# Patient Record
Sex: Female | Born: 1975 | Race: White | Hispanic: No | Marital: Married | Smoking: Never smoker
Health system: Southern US, Community
[De-identification: ages and names within clinical notes are randomized; demographics above are authoritative.]

## PROBLEM LIST (undated history)

## (undated) DIAGNOSIS — G5600 Carpal tunnel syndrome, unspecified upper limb: Secondary | ICD-10-CM

## (undated) HISTORY — PX: GASTRIC BYPASS: SHX52

---

## 2008-08-27 ENCOUNTER — Other Ambulatory Visit: Payer: Self-pay | Admitting: Emergency Medicine

## 2008-08-27 ENCOUNTER — Inpatient Hospital Stay (HOSPITAL_COMMUNITY): Admission: AD | Admit: 2008-08-27 | Discharge: 2008-08-28 | Payer: Self-pay | Admitting: Obstetrics and Gynecology

## 2008-08-30 ENCOUNTER — Inpatient Hospital Stay (HOSPITAL_COMMUNITY): Admission: AD | Admit: 2008-08-30 | Discharge: 2008-08-30 | Payer: Self-pay | Admitting: Obstetrics and Gynecology

## 2008-09-06 ENCOUNTER — Emergency Department (HOSPITAL_BASED_OUTPATIENT_CLINIC_OR_DEPARTMENT_OTHER): Admission: EM | Admit: 2008-09-06 | Discharge: 2008-09-06 | Payer: Self-pay | Admitting: Emergency Medicine

## 2011-08-17 LAB — URINE MICROSCOPIC-ADD ON

## 2011-08-17 LAB — URINALYSIS, ROUTINE W REFLEX MICROSCOPIC
Bilirubin Urine: NEGATIVE
Glucose, UA: NEGATIVE
Ketones, ur: NEGATIVE
Specific Gravity, Urine: 1.018
Urobilinogen, UA: 1
pH: 7

## 2011-08-17 LAB — WET PREP, GENITAL: Trich, Wet Prep: NONE SEEN

## 2011-08-17 LAB — GC/CHLAMYDIA PROBE AMP, GENITAL
Chlamydia, DNA Probe: NEGATIVE
GC Probe Amp, Genital: NEGATIVE

## 2011-08-17 LAB — PREGNANCY, URINE: Preg Test, Ur: POSITIVE

## 2018-12-03 ENCOUNTER — Emergency Department (HOSPITAL_COMMUNITY)
Admission: EM | Admit: 2018-12-03 | Discharge: 2018-12-03 | Disposition: A | Discharge status: Home / Self Care | Payer: BC Managed Care – PPO | Attending: Emergency Medicine | Admitting: Emergency Medicine

## 2018-12-03 ENCOUNTER — Other Ambulatory Visit: Payer: Self-pay

## 2018-12-03 ENCOUNTER — Emergency Department (HOSPITAL_COMMUNITY): Payer: BC Managed Care – PPO

## 2018-12-03 ENCOUNTER — Encounter (HOSPITAL_COMMUNITY): Payer: Self-pay

## 2018-12-03 DIAGNOSIS — K2970 Gastritis, unspecified, without bleeding: Secondary | ICD-10-CM | POA: Diagnosis not present

## 2018-12-03 DIAGNOSIS — R1013 Epigastric pain: Secondary | ICD-10-CM | POA: Diagnosis present

## 2018-12-03 HISTORY — DX: Carpal tunnel syndrome, unspecified upper limb: G56.00

## 2018-12-03 LAB — I-STAT CHEM 8, ED
BUN: 11 mg/dL (ref 6–20)
CALCIUM ION: 1.22 mmol/L (ref 1.15–1.40)
Chloride: 106 mmol/L (ref 98–111)
Creatinine, Ser: 0.6 mg/dL (ref 0.44–1.00)
Glucose, Bld: 94 mg/dL (ref 70–99)
HCT: 37 % (ref 36.0–46.0)
HEMOGLOBIN: 12.6 g/dL (ref 12.0–15.0)
POTASSIUM: 3.9 mmol/L (ref 3.5–5.1)
SODIUM: 142 mmol/L (ref 135–145)
TCO2: 26 mmol/L (ref 22–32)

## 2018-12-03 LAB — COMPREHENSIVE METABOLIC PANEL
ALBUMIN: 3.5 g/dL (ref 3.5–5.0)
ALT: 32 U/L (ref 0–44)
AST: 45 U/L — ABNORMAL HIGH (ref 15–41)
Alkaline Phosphatase: 71 U/L (ref 38–126)
Anion gap: 10 (ref 5–15)
BUN: 8 mg/dL (ref 6–20)
CHLORIDE: 107 mmol/L (ref 98–111)
CO2: 23 mmol/L (ref 22–32)
Calcium: 9 mg/dL (ref 8.9–10.3)
Creatinine, Ser: 0.7 mg/dL (ref 0.44–1.00)
GFR calc non Af Amer: >60 mL/min (ref >60)
GLUCOSE: 100 mg/dL — AB (ref 70–99)
POTASSIUM: 4 mmol/L (ref 3.5–5.1)
SODIUM: 140 mmol/L (ref 135–145)
Total Bilirubin: 0.6 mg/dL (ref 0.3–1.2)
Total Protein: 6.2 g/dL — ABNORMAL LOW (ref 6.5–8.1)

## 2018-12-03 LAB — LIPASE, BLOOD: Lipase: 28 U/L (ref 11–51)

## 2018-12-03 LAB — CBC WITH DIFFERENTIAL/PLATELET
ABS IMMATURE GRANULOCYTES: 0.03 10*3/uL (ref 0.00–0.07)
BASOS ABS: 0 10*3/uL (ref 0.0–0.1)
Basophils Relative: 0 %
Eosinophils Absolute: 0.1 10*3/uL (ref 0.0–0.5)
Eosinophils Relative: 1 %
HEMATOCRIT: 39 % (ref 36.0–46.0)
Hemoglobin: 12.6 g/dL (ref 12.0–15.0)
IMMATURE GRANULOCYTES: 0 %
LYMPHS ABS: 1.2 10*3/uL (ref 0.7–4.0)
LYMPHS PCT: 14 %
MCH: 29 pg (ref 26.0–34.0)
MCHC: 32.3 g/dL (ref 30.0–36.0)
MCV: 89.9 fL (ref 80.0–100.0)
MONOS PCT: 7 %
Monocytes Absolute: 0.6 10*3/uL (ref 0.1–1.0)
NEUTROS ABS: 7.2 10*3/uL (ref 1.7–7.7)
NRBC: 0 % (ref 0.0–0.2)
Neutrophils Relative %: 78 %
Platelets: 140 10*3/uL — ABNORMAL LOW (ref 150–400)
RBC: 4.34 MIL/uL (ref 3.87–5.11)
RDW: 12.9 % (ref 11.5–15.5)
WBC: 9.2 10*3/uL (ref 4.0–10.5)

## 2018-12-03 LAB — I-STAT BETA HCG BLOOD, ED (MC, WL, AP ONLY)

## 2018-12-03 MED ORDER — MORPHINE SULFATE (PF) 4 MG/ML IV SOLN
6.0000 mg | Freq: Once | INTRAVENOUS | Status: AC
Start: 2018-12-03 — End: 2018-12-03
  Administered 2018-12-03: 6 mg via INTRAVENOUS
  Filled 2018-12-03: qty 2

## 2018-12-03 MED ORDER — PANTOPRAZOLE SODIUM 20 MG PO TBEC: 20.0000 mg | DELAYED_RELEASE_TABLET | Freq: Every day | ORAL | 0 refills | Status: AC

## 2018-12-03 MED ORDER — SUCRALFATE 1 G PO TABS: 1.0000 g | ORAL_TABLET | Freq: Three times a day (TID) | ORAL | 0 refills | Status: AC

## 2018-12-03 MED ORDER — ONDANSETRON HCL 4 MG/2ML IJ SOLN: 4.0000 mg | Freq: Four times a day (QID) | INTRAMUSCULAR | Status: DC | PRN

## 2018-12-03 MED ORDER — MORPHINE SULFATE (PF) 4 MG/ML IV SOLN: 6.0000 mg | INTRAVENOUS | Status: DC | PRN

## 2018-12-03 MED ORDER — IOHEXOL 300 MG/ML  SOLN
100.0000 mL | Freq: Once | INTRAMUSCULAR | Status: AC | PRN
  Administered 2018-12-03: 100 mL via INTRAVENOUS

## 2018-12-03 MED ORDER — PANTOPRAZOLE SODIUM 40 MG IV SOLR
40.0000 mg | Freq: Once | INTRAVENOUS | Status: AC
Start: 2018-12-03 — End: 2018-12-03
  Administered 2018-12-03: 40 mg via INTRAVENOUS
  Filled 2018-12-03: qty 40

## 2018-12-03 NOTE — ED Provider Notes (Signed)
  Physical Exam  BP 133/84   Pulse 68   Temp 97.8 F (36.6 C) (Oral)   Resp 14   Ht 4\' 10"  (1.473 m)   Wt 59 kg   SpO2 95%   BMI 27.17 kg/m   Physical Exam Vitals signs and nursing note reviewed.  Constitutional:      General: She is not in acute distress.    Appearance: She is well-developed. She is not diaphoretic.  HENT:     Head: Normocephalic and atraumatic.  Eyes:     General: No scleral icterus.    Conjunctiva/sclera: Conjunctivae normal.  Neck:     Musculoskeletal: Normal range of motion.  Pulmonary:     Effort: Pulmonary effort is normal. No respiratory distress.  Skin:    Findings: No rash.  Neurological:     Mental Status: She is alert.     ED Course/Procedures     Procedures   Ct Abdomen Pelvis W Contrast  Result Date: 12/03/2018 CLINICAL DATA:  Epigastric pain.  Pain during dinner EXAM: CT ABDOMEN AND PELVIS WITH CONTRAST TECHNIQUE: Multidetector CT imaging of the abdomen and pelvis was performed using the standard protocol following bolus administration of intravenous contrast. CONTRAST:  OMNIPAQUE IOHEXOL 300 MG/ML  SOLN COMPARISON:  Upper GI 01/09/2018 FINDINGS: Lower chest: Lung bases are clear. Hepatobiliary: No focal hepatic lesion. No biliary duct dilatation. Gallbladder is normal. Common bile duct is normal. Pancreas: Pancreas is normal. No ductal dilatation. No pancreatic inflammation. Spleen: Normal spleen Adrenals/urinary tract: Adrenal glands and kidneys are normal. The ureters and bladder normal. Stomach/Bowel: Gastric sleeve bariatric surgery anatomy. No evidence obstruction. Duodenum, small-bowel appendix normal. The colon and rectosigmoid colon are normal. Vascular/Lymphatic: Abdominal aorta is normal caliber. No periportal or retroperitoneal adenopathy. No pelvic adenopathy. Reproductive: IUD in expected location in the uterus. Tubal ligation clips noted. No adnexal abnormality. Other: No free fluid. Musculoskeletal: No aggressive osseous  lesion. IMPRESSION: 1. No acute abdominopelvic findings. 2. 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 3. Normal appendix. 4. Normal gallbladder. Electronically Signed   By: Genevive Bi M.D.   On: 12/03/2018 20:29    MDM  Care handed off from previous provider Dr. Rhunette Croft.  Please see his note for further detail.  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  Plan is to obtain lab work, CT of the abdomen pelvis for cause and reassess.  Given Protonix.  CT shows no complications of gastric sleeve or other abnormalities.  Lab work is unremarkable.  Will be discharged home with Protonix and Carafate.  Patient is hemodynamically stable, in NAD, and able to ambulate in the ED. Evaluation does not show pathology that would require ongoing emergent intervention or inpatient treatment. I explained the diagnosis to the patient. Pain has been managed and has no complaints prior to discharge. Patient is comfortable with above plan and is stable for discharge at this time. All questions were answered prior to disposition. Strict return precautions for returning to the ED were discussed. Encouraged follow up with PCP.    Portions of this note were generated with Scientist, clinical (histocompatibility and immunogenetics). Dictation errors may occur despite best attempts at proofreading.       Dietrich Pates, PA-C 12/03/18 2040    Sabas Sous, MD 12/03/18 [phone]

## 2018-12-03 NOTE — ED Triage Notes (Signed)
Pt endorses she was eating with friends when she had sudden 10/10 sharp epigastric pain and sweating. Pt was given 324 ASA en route. Pt denies N/V/D. Per EMS 12-lead unremarkable. Pt reports pain is now 7/10. Pt had gastric bypass in March, but denies any complications.

## 2018-12-03 NOTE — ED Provider Notes (Signed)
[facility] Provider Note   CSN: [id number] Arrival date & time: 12/03/18  1643     History   Chief Complaint Chief Complaint  Patient presents with  . Abdominal Pain    HPI Alexandra Farley is a 43 y.o. female.  HPI  43 year old female comes in with chief complaint of abdominal pain. Patient has history of gastric replacement in March 2019, multiple C-sections.  She states that after she had solid, she started having severe epigastric abdominal pain.  The pain is constant and nonradiating.  Pain is located mainly in the epigastric region and she denies any history of similar pain.  She denies any UTI-like symptoms, history of kidney stones or gallbladder problems.  She does not drink heavily or use any drugs.  Past Medical History:  Diagnosis Date  . Carpal tunnel syndrome    bilateral hands    There are no active problems to display for this patient.   Past Surgical History:  Procedure Laterality Date  . CESAREAN SECTION    . GASTRIC BYPASS       OB History   No obstetric history on file.      Home Medications    Prior to Admission medications   Not on File    Family History No family history on file.  Social History Social History   Tobacco Use  . Smoking status: Never Smoker  . Smokeless tobacco: Never Used  Substance Use Topics  . Alcohol use: Not Currently  . Drug use: Not on file     Allergies   Patient has no known allergies.   Review of Systems Review of Systems  Constitutional: Positive for activity change.  Respiratory: Negative for shortness of breath.   Cardiovascular: Negative for chest pain.  Gastrointestinal: Positive for abdominal pain and nausea.  Genitourinary: Negative for flank pain.  All other systems reviewed and are negative.    Physical Exam Updated Vital Signs BP 133/84   Pulse 68   Temp 97.8 F (36.6 C) (Oral)   Resp 14   Ht 4\' 10"  (1.473 m)   Wt 59 kg   SpO2 95%    BMI 27.17 kg/m   Physical Exam Vitals signs and nursing note reviewed.  Constitutional:      Appearance: She is well-developed.  HENT:     Head: Normocephalic and atraumatic.  Neck:     Musculoskeletal: Normal range of motion and neck supple.  Cardiovascular:     Rate and Rhythm: Normal rate.  Pulmonary:     Effort: Pulmonary effort is normal.  Abdominal:     General: Bowel sounds are normal.     Tenderness: There is abdominal tenderness in the epigastric area. There is guarding. There is no rebound.     Hernia: No hernia is present.  Skin:    General: Skin is warm and dry.  Neurological:     Mental Status: She is alert and oriented to person, place, and time.      ED Treatments / Results  Labs (all labs ordered are listed, but only abnormal results are displayed) Labs Reviewed  COMPREHENSIVE METABOLIC PANEL  CBC WITH DIFFERENTIAL/PLATELET  LIPASE, BLOOD  I-STAT BETA HCG BLOOD, ED ([facility], [facility], [facility] ONLY)  I-STAT CHEM 8, ED    EKG EKG Interpretation  Date/Time:  Sunday December 03 2018 16:51:49 EST Ventricular Rate:  70 PR Interval:  120 QRS Duration: 76 QT Interval:  394 QTC Calculation: 425 R Axis:   40  Text Interpretation:  Normal sinus rhythm Normal ECG No acute changes No old tracing to compare Confirmed by Derwood KaplanNanavati, Allysen Lazo [phone]([id number]) on 12/03/2018 6:28:37 PM   Radiology No results found.  Procedures Procedures (including critical care time)  Medications Ordered in ED Medications  morphine 4 MG/ML injection 6 mg (has no administration in time range)  ondansetron (ZOFRAN) injection 4 mg (has no administration in time range)  pantoprazole (PROTONIX) injection 40 mg (has no administration in time range)  morphine 4 MG/ML injection 6 mg (6 mg Intravenous Given 12/03/18 1852)     Initial Impression / Assessment and Plan / ED Course  I have reviewed the triage vital signs and the nursing notes.  Pertinent labs & imaging results that were available during my care  of the patient were reviewed by me and considered in my medical decision making (see chart for details).     DDx includes: Pancreatitis Hepatobiliary pathology including cholecystitis Gastritis/PUD SBO  43 year old with history of gastric sleeve repair in March 2019 and multiple C-sections comes in with epigastric abdominal pain.  Pain is sudden onset and has been constant.  Clinical concerns are high for small bowel obstruction versus internal hernia.  CT scan ordered.  Pancreatitis and cholelithiasis are lower in the differential.  If the CT scan is completely negative, then we will treat patient as if she has peptic ulcer disease as she is at high risk for it and she has not been taking her PPI as recommended. Signing care out to incoming team.   Final Clinical Impressions(s) / ED Diagnoses   Final diagnoses:  None    ED Discharge Orders    None       Derwood KaplanNanavati, Danzig Macgregor, MD 12/03/18 1904

## 2018-12-03 NOTE — ED Notes (Signed)
Patient transported to CT 

## 2019-07-27 IMAGING — CT CT ABD-PELV W/ CM
3 of 5 series · 16 of 46 positions shown, 18 images · IV contrast (APPLIED)
Comparison: Upper GI 01/09/2018

CLINICAL DATA: Epigastric pain.  Pain during dinner

EXAM:
CT ABDOMEN AND PELVIS WITH CONTRAST
TECHNIQUE: Multidetector CT imaging of the abdomen and pelvis was performed
using the standard protocol following bolus administration of
intravenous contrast.
CONTRAST:  100mL OMNIPAQUE IOHEXOL 300 MG/ML  SOLN

[Series 3: abdomen 5.0 · axial · 0.68mm/px · z∈[-439,-64]mm · 11 of 91 slices shown, 13 images]
[im 8/91  soft-tissue]
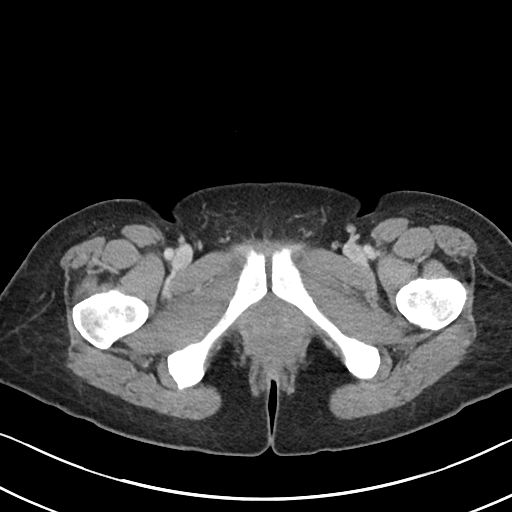
[im 8/91  bone]
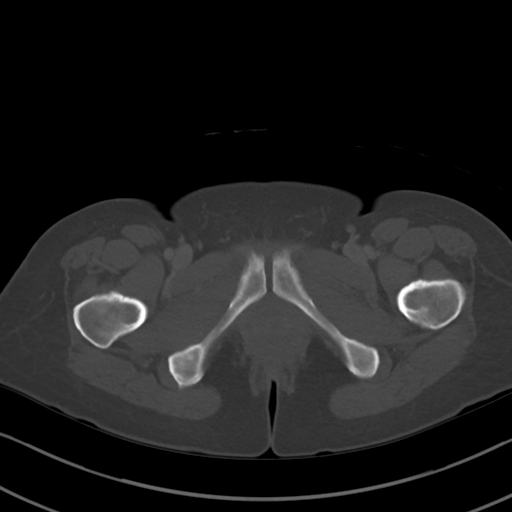
[im 16/91  soft-tissue]
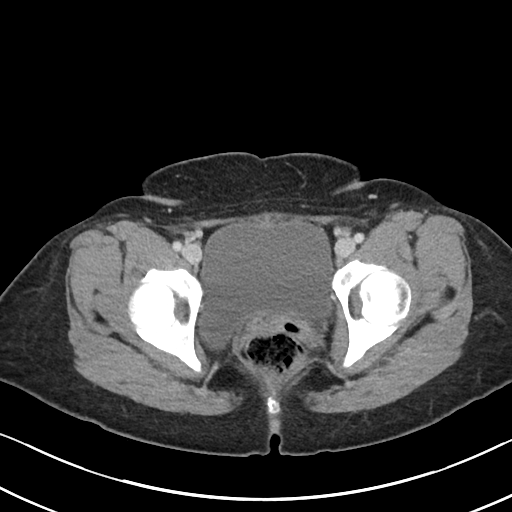
[im 23/91  soft-tissue]
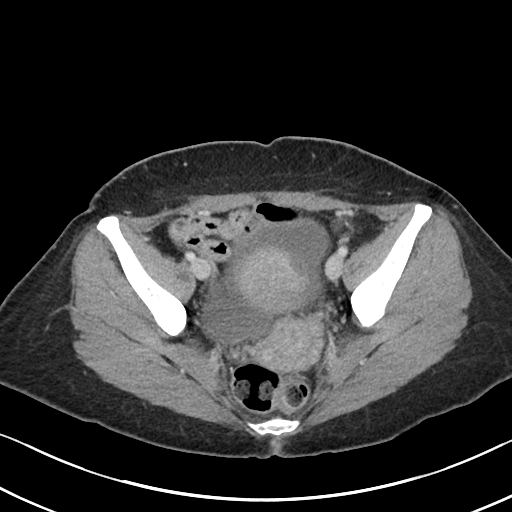
[im 31/91  soft-tissue]
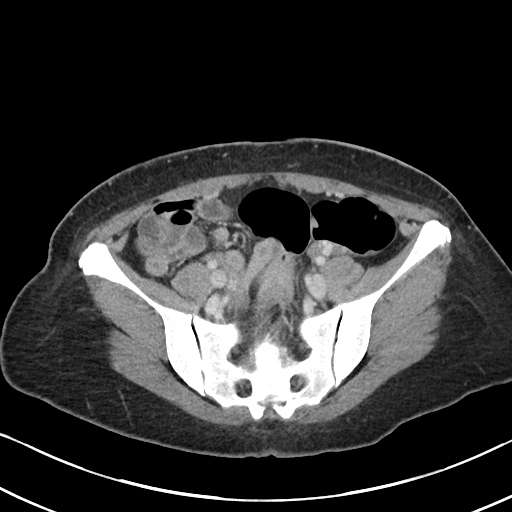
[im 38/91  soft-tissue]
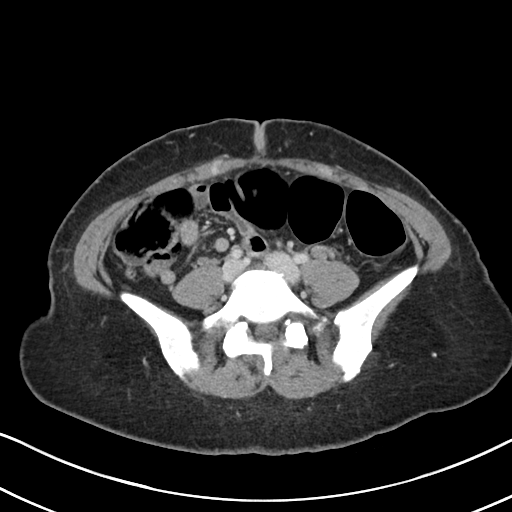
[im 46/91  soft-tissue]
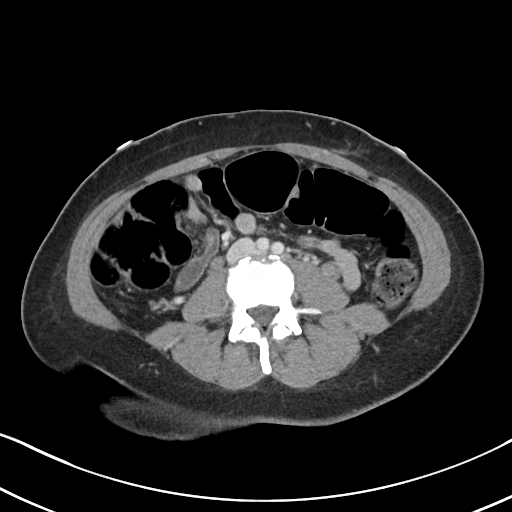
[im 53/91  soft-tissue]
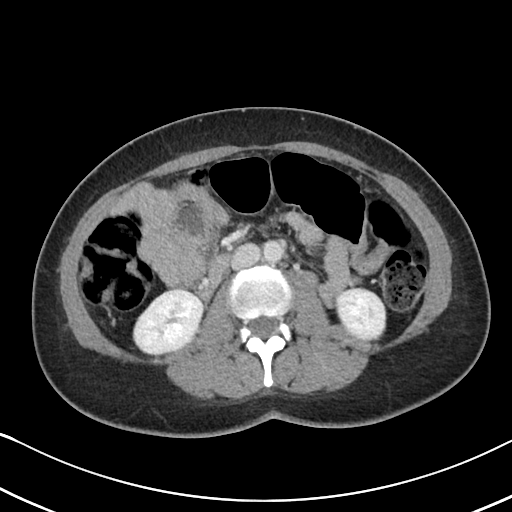
[im 61/91  soft-tissue]
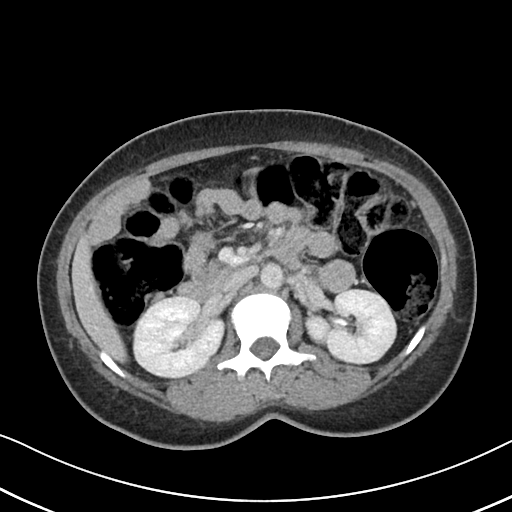
[im 68/91  soft-tissue]
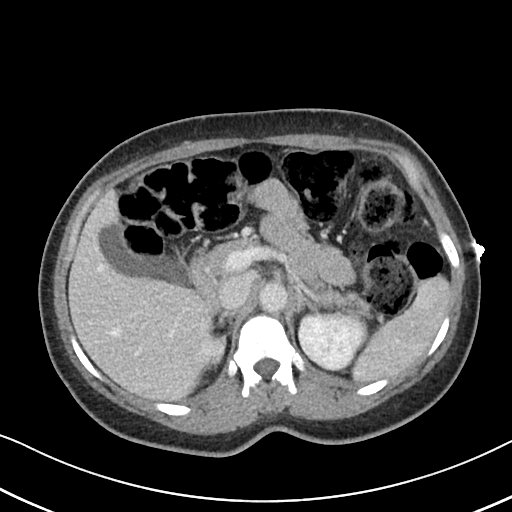
[im 68/91  bone]
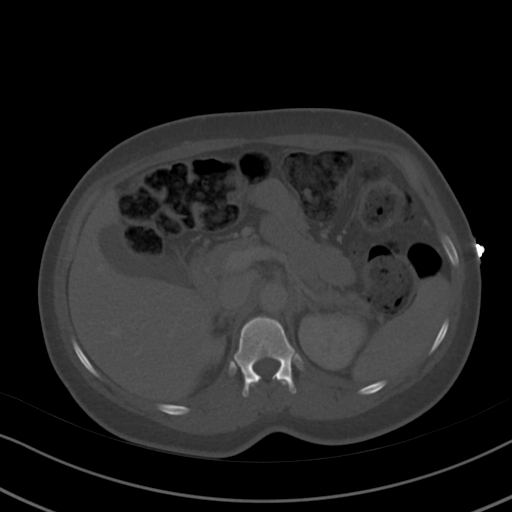
[im 76/91  soft-tissue]
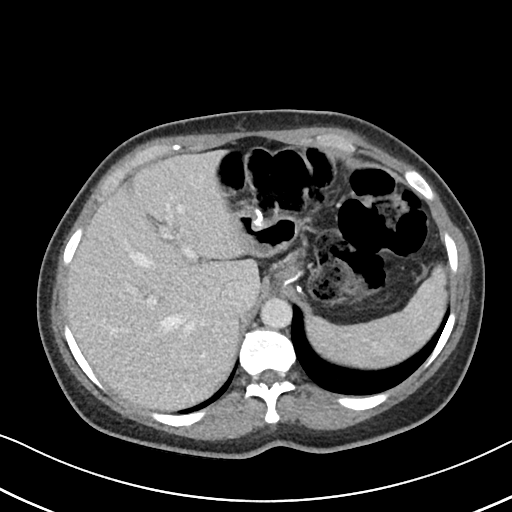
[im 83/91  soft-tissue]
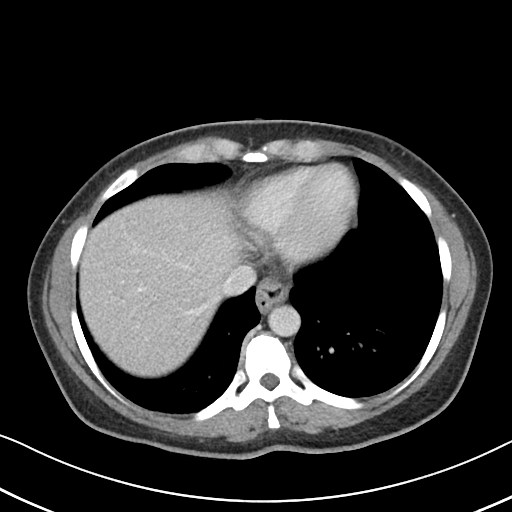

[Series 5: lung · axial · 0.68mm/px · z∈[-202,-172]mm · 2 of 97 slices shown]
[im 8/97  bone]
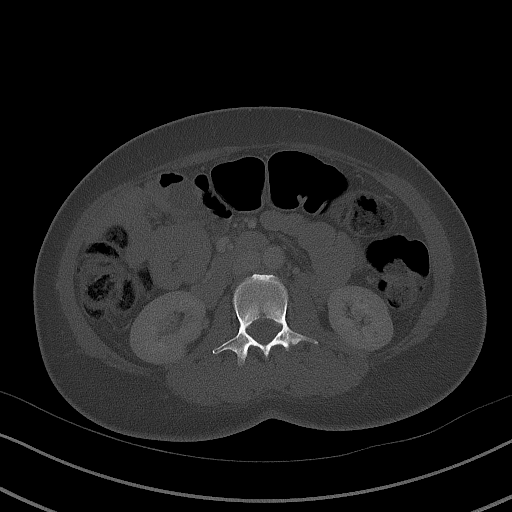
[im 23/97  bone]
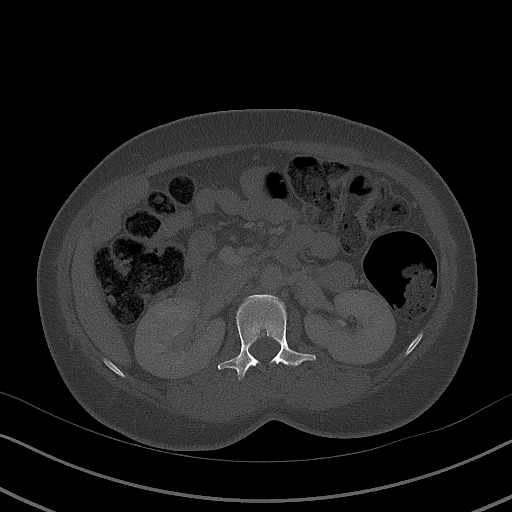

[Series 6: abdomen 3.0 mpr cor · coronal · 0.60mm/px · 3 of 76 slices shown]
[im 26/76  soft-tissue]
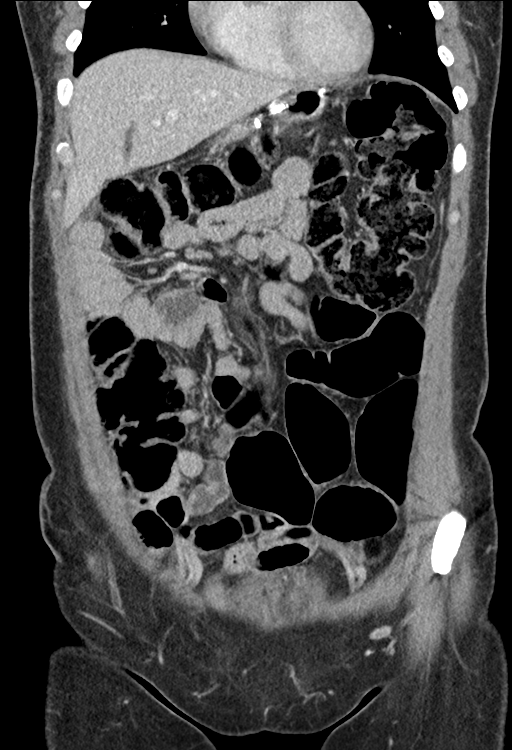
[im 34/76  soft-tissue]
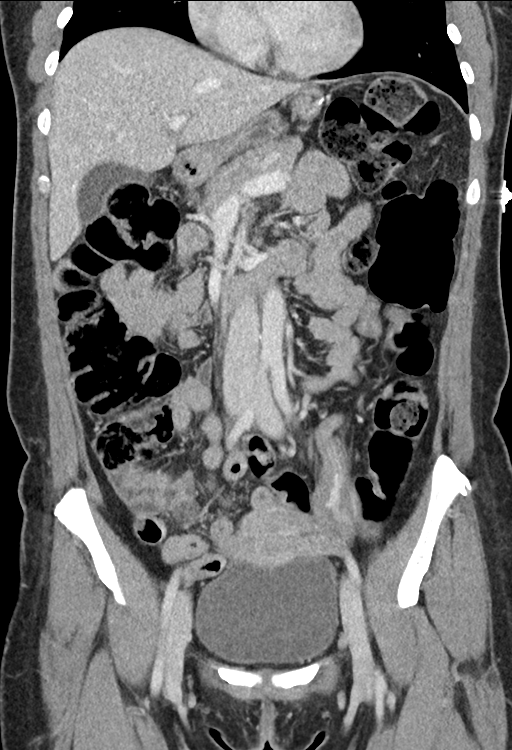
[im 42/76  soft-tissue]
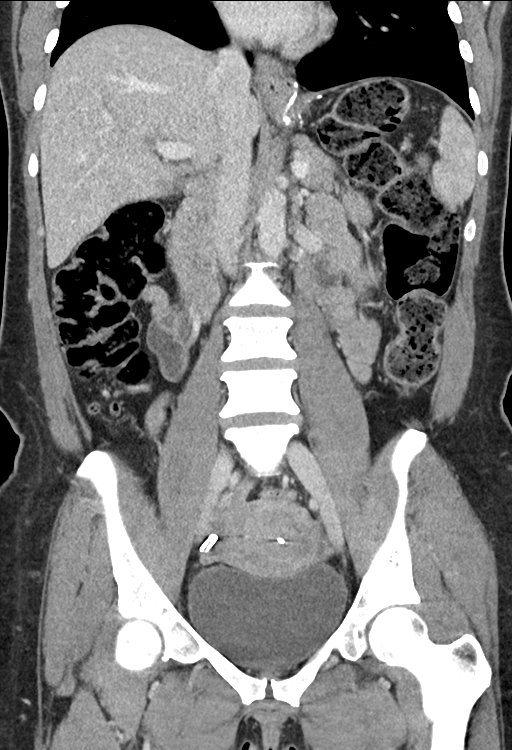

[16 of 46 positions shown; findings below may reference images not displayed]

FINDINGS: Lower chest: Lung bases are clear.

Hepatobiliary: No focal hepatic lesion. No biliary duct dilatation.
Gallbladder is normal. Common bile duct is normal.

Pancreas: Pancreas is normal. No ductal dilatation. No pancreatic
inflammation.

Spleen: Normal spleen

Adrenals/urinary tract: Adrenal glands and kidneys are normal. The
ureters and bladder normal.

Stomach/Bowel: Gastric sleeve bariatric surgery anatomy. No evidence
obstruction. Duodenum, small-bowel appendix normal. The colon and
rectosigmoid colon are normal.

Vascular/Lymphatic: Abdominal aorta is normal caliber. No periportal
or retroperitoneal adenopathy. No pelvic adenopathy.

Reproductive: IUD in expected location in the uterus. Tubal ligation
clips noted. No adnexal abnormality.

Other: No free fluid.

Musculoskeletal: No aggressive osseous lesion.
IMPRESSION: 1. No acute abdominopelvic findings.
2. Gastric sleeve bariatric surgery anatomy without complication.
3. Normal appendix.
4. Normal gallbladder.
